# Patient Record
Sex: Male | Born: 1980 | Marital: Single | Smoking: Former smoker
Health system: Southern US, Community
[De-identification: ages and names within clinical notes are randomized; demographics above are authoritative.]

## PROBLEM LIST (undated history)

## (undated) DIAGNOSIS — K580 Irritable bowel syndrome with diarrhea: Secondary | ICD-10-CM

## (undated) DIAGNOSIS — K644 Residual hemorrhoidal skin tags: Secondary | ICD-10-CM

## (undated) DIAGNOSIS — K219 Gastro-esophageal reflux disease without esophagitis: Secondary | ICD-10-CM

## (undated) DIAGNOSIS — K649 Unspecified hemorrhoids: Secondary | ICD-10-CM

## (undated) HISTORY — DX: Residual hemorrhoidal skin tags: K64.4

## (undated) HISTORY — DX: Irritable bowel syndrome with diarrhea: K58.0

## (undated) HISTORY — PX: OTHER SURGICAL HISTORY: SHX169

## (undated) HISTORY — DX: Unspecified hemorrhoids: K64.9

---

## 2013-02-11 ENCOUNTER — Encounter: Payer: Self-pay | Admitting: Internal Medicine

## 2013-02-11 ENCOUNTER — Ambulatory Visit: Payer: Self-pay | Admitting: Physician Assistant

## 2013-02-11 ENCOUNTER — Encounter: Payer: Self-pay | Admitting: Gastroenterology

## 2013-02-11 ENCOUNTER — Telehealth: Payer: Self-pay

## 2013-02-11 ENCOUNTER — Other Ambulatory Visit (INDEPENDENT_AMBULATORY_CARE_PROVIDER_SITE_OTHER): Payer: BC Managed Care – PPO

## 2013-02-11 ENCOUNTER — Ambulatory Visit (INDEPENDENT_AMBULATORY_CARE_PROVIDER_SITE_OTHER): Payer: BC Managed Care – PPO | Admitting: Gastroenterology

## 2013-02-11 VITALS — BP 140/84 | HR 66 | Ht 69.5 in | Wt 207.6 lb

## 2013-02-11 DIAGNOSIS — R109 Unspecified abdominal pain: Secondary | ICD-10-CM | POA: Insufficient documentation

## 2013-02-11 DIAGNOSIS — K625 Hemorrhage of anus and rectum: Secondary | ICD-10-CM

## 2013-02-11 DIAGNOSIS — R198 Other specified symptoms and signs involving the digestive system and abdomen: Secondary | ICD-10-CM | POA: Insufficient documentation

## 2013-02-11 DIAGNOSIS — R194 Change in bowel habit: Secondary | ICD-10-CM | POA: Insufficient documentation

## 2013-02-11 LAB — CBC WITH DIFFERENTIAL/PLATELET
Basophils Absolute: 0 10*3/uL (ref 0.0–0.1)
Basophils Relative: 0.4 % (ref 0.0–3.0)
Eosinophils Relative: 1 % (ref 0.0–5.0)
HCT: 48.3 % (ref 39.0–52.0)
Hemoglobin: 16.2 g/dL (ref 13.0–17.0)
Lymphocytes Relative: 22.9 % (ref 12.0–46.0)
Lymphs Abs: 2.1 10*3/uL (ref 0.7–4.0)
Monocytes Relative: 5.7 % (ref 3.0–12.0)
Neutro Abs: 6.4 10*3/uL (ref 1.4–7.7)
RBC: 5.25 Mil/uL (ref 4.22–5.81)
RDW: 12.9 % (ref 11.5–14.6)
WBC: 9.2 10*3/uL (ref 4.5–10.5)

## 2013-02-11 LAB — C-REACTIVE PROTEIN: CRP: 0.6 mg/dL (ref 0.5–20.0)

## 2013-02-11 LAB — COMPREHENSIVE METABOLIC PANEL
Albumin: 4.3 g/dL (ref 3.5–5.2)
BUN: 14 mg/dL (ref 6–23)
CO2: 26 mEq/L (ref 19–32)
Calcium: 9.5 mg/dL (ref 8.4–10.5)
Chloride: 101 mEq/L (ref 96–112)
Glucose, Bld: 98 mg/dL (ref 70–99)
Potassium: 4.2 mEq/L (ref 3.5–5.1)

## 2013-02-11 MED ORDER — HYOSCYAMINE SULFATE 0.125 MG SL SUBL: 0.1250 mg | SUBLINGUAL_TABLET | Freq: Four times a day (QID) | SUBLINGUAL | Status: DC | PRN

## 2013-02-11 NOTE — Progress Notes (Addendum)
02/11/2013 Jacob Barnett [id number] Dec 20, 1980   HISTORY OF PRESENT ILLNESS:  Patient is a pleasant 32 year old male who presents to our office today with complaints of abdominal pain, gas/bloating, loose stools, and rectal bleeding, which have all been occurring intermittently for the past 3 months or so.  Has diffuse abdominal pains that come and go.  Is having only two bowel movements a day, but they have always loose since he has not been feeling well.  Sees blood on the toilet paper and in the stool.  A lot of gas and bloating.  Never had symptoms similar to this in the past.  No family history of IBD, celiac disease, or GI malignancy to his knowledge.  He was at an urgent care for these symptoms at some point within the past 3 months and was given a course of cipro and flagyl for possible infectious colitis/gastroenteritis.  Says that his symptoms may have improved for a short time, however, they returned again and have been worsening.  Says that he ate a gluten-free diet for two weeks and felt somewhat better.    History reviewed. No pertinent past medical history. Past Surgical History  Procedure Laterality Date  . Neg hx      reports that he has been smoking Cigarettes.  He has a 6.5 pack-year smoking history. He has never used smokeless tobacco. He reports that  drinks alcohol. He reports that he does not use illicit drugs. family history includes Alzheimer's disease in his maternal grandfather and paternal grandmother.  There is no history of Colon cancer. Allergies  Allergen Reactions  . Penicillins     As a child      No outpatient encounter prescriptions on file as of 02/11/2013.   No facility-administered encounter medications on file as of 02/11/2013.     REVIEW OF SYSTEMS  : All other systems reviewed and negative except where noted in the History of Present Illness.   PHYSICAL EXAM: BP 140/84  Pulse 66  Ht 5' 9.5" (1.765 m)  Wt 207 lb 9.6 oz (94.167 kg)  BMI 30.23  kg/m2 General: Well developed white male in no acute distress Head: Normocephalic and atraumatic Eyes:  sclerae anicteric,conjunctive pink. Ears: Normal auditory acuity Lungs: Clear throughout to auscultation Heart: Regular rate and rhythm Abdomen: Soft, non-distended. No masses or hepatomegaly noted.  Normal bowel sounds.  Mild diffuse TTP without R/R/G. Rectal:  Deferred.  Will be done at the time of colonoscopy.  Musculoskeletal: Symmetrical with no gross deformities  Skin: No lesions on visible extremities Extremities: No edema  Neurological: Alert oriented x 4, grossly nonfocal Psychological:  Alert and cooperative. Normal mood and affect  ASSESSMENT AND PLAN: -Abdominal pain with gas/bloating, change in bowel habits with loose stools, and rectal bleeding.  Rule out IBD, celiac, etc.  Will check labs including CBC, CMP, TSH, sed rate, CRP, and celiac titers.  Will schedule colonoscopy as well.  Can try levsin prn in the interim.  Addendum: Reviewed and agree with initial management. Beverley Fiedler, MD

## 2013-02-11 NOTE — Telephone Encounter (Signed)
Pt states he is having abdominal pain in his LLQ and at times his RLQ. States that when you push on his stomach it is very tender in those areas. Pt states that he has been having loose stools with blood in the stool. Pt states he was seen at an urgent care about a month ago and placed on antibiotics for 10days and the symptoms got better for a while but once off antibiotics he started having the same symptoms but they have gotten worse. Pt scheduled to see Doug Sou PA today at 3pm. Pt aware of appt date and time.

## 2013-02-11 NOTE — Telephone Encounter (Signed)
Patient called requesting to see a GI doctor.  He has blood in stool/painful and loose stools.  I offered him 03/08/13 with Dr. Marina Goodell but he feels he needs to be seen sooner and asked if we had a PA that could see him.

## 2013-02-11 NOTE — Patient Instructions (Addendum)
Your physician has requested that you go to the basement for the following lab work before leaving today: CBC, Cmet, TSH, sed rate, CRP, TTG, IGA.  We have sent the following medications to your pharmacy for you to pick up at your convenience: Levsin.  You have been scheduled for a colonoscopy with propofol. Please follow written instructions given to you at your visit today.  Please pick up your prep kit at the pharmacy within the next 1-3 days. If you use inhalers (even only as needed), please bring them with you on the day of your procedure.

## 2013-02-12 ENCOUNTER — Telehealth: Payer: Self-pay | Admitting: Gastroenterology

## 2013-02-12 LAB — TISSUE TRANSGLUTAMINASE, IGA: Tissue Transglutaminase Ab, IgA: 5 U/mL (ref <20)

## 2013-02-13 ENCOUNTER — Encounter: Payer: Self-pay | Admitting: *Deleted

## 2013-02-13 NOTE — Telephone Encounter (Signed)
Thurman Coyer to tell patient Im calling the colonoscopy prep in. I spoke to a pharmacist at 10:21 Am today 02-13-2013 at [facility].

## 2013-02-14 ENCOUNTER — Encounter: Payer: Self-pay | Admitting: Internal Medicine

## 2013-02-14 ENCOUNTER — Ambulatory Visit (AMBULATORY_SURGERY_CENTER): Payer: BC Managed Care – PPO | Admitting: Internal Medicine

## 2013-02-14 VITALS — BP 127/86 | HR 52 | Temp 98.0°F | Resp 18 | Ht 69.0 in | Wt 207.0 lb

## 2013-02-14 DIAGNOSIS — R109 Unspecified abdominal pain: Secondary | ICD-10-CM

## 2013-02-14 DIAGNOSIS — R198 Other specified symptoms and signs involving the digestive system and abdomen: Secondary | ICD-10-CM

## 2013-02-14 DIAGNOSIS — K649 Unspecified hemorrhoids: Secondary | ICD-10-CM

## 2013-02-14 DIAGNOSIS — K625 Hemorrhage of anus and rectum: Secondary | ICD-10-CM

## 2013-02-14 DIAGNOSIS — R194 Change in bowel habit: Secondary | ICD-10-CM

## 2013-02-14 MED ORDER — HYDROCORTISONE 2.5 % RE CREA: TOPICAL_CREAM | Freq: Three times a day (TID) | RECTAL | Status: DC

## 2013-02-14 MED ORDER — SODIUM CHLORIDE 0.9 % IV SOLN: 500.0000 mL | INTRAVENOUS | Status: DC

## 2013-02-14 NOTE — Progress Notes (Signed)
Patient did not experience any of the following events: a burn prior to discharge; a fall within the facility; wrong site/side/patient/procedure/implant event; or a hospital transfer or hospital admission upon discharge from the facility. (G8907) Patient did not have preoperative order for IV antibiotic SSI prophylaxis. (G8918)  

## 2013-02-14 NOTE — Patient Instructions (Addendum)

## 2013-02-14 NOTE — Op Note (Signed)
[facility] [address] [place], [address]   COLONOSCOPY PROCEDURE REPORT  PATIENT: Jacob, Barnett  MR#: [id number] BIRTHDATE: 1980/12/14 , 31  yrs. old GENDER: Male ENDOSCOPIST: Beverley Fiedler, MD PROCEDURE DATE:  02/14/2013 PROCEDURE:   Colonoscopy, diagnostic ASA CLASS:   Class I INDICATIONS:Rectal Bleeding, Abdominal pain, Loose stools, and Change in bowel habits. MEDICATIONS: MAC sedation, administered by CRNA and propofol (Diprivan) 350mg  IV  DESCRIPTION OF PROCEDURE:   After the risks benefits and alternatives of the procedure were thoroughly explained, informed consent was obtained.  A digital rectal exam revealed no rectal mass and A digital rectal exam revealed external hemorrhoids.   The LB CF-[id number]  endoscope was introduced through the anus and advanced to the terminal ileum which was intubated for a short distance. No adverse events experienced.   The quality of the prep was good, using MoviPrep  The instrument was then slowly withdrawn as the colon was fully examined.    COLON FINDINGS: The mucosa appeared normal in the terminal ileum. A normal appearing cecum, ileocecal valve, and appendiceal orifice were identified.  The ascending, hepatic flexure, transverse, splenic flexure, descending, sigmoid colon and rectum appeared unremarkable.  No polyps or cancers were seen.  Retroflexed views revealed no abnormalities. The time to cecum=2 minutes 02 seconds. Withdrawal time=7 minutes 25 seconds.  The scope was withdrawn and the procedure completed.  COMPLICATIONS: There were no complications.  ENDOSCOPIC IMPRESSION: 1.   Normal mucosa in the terminal ileum 2.   Normal colon 3.   External hemorrhoid (small)  RECOMMENDATIONS: 1.  Anusol-HC cream three times daily for external hemorrhoids. 2.  Office follow-up within 1 month 3.  Trial of Restora 1 capsule daily   eSigned:  Beverley Fiedler, MD 02/14/2013 3:49 PM   CC: The  Patient

## 2013-02-14 NOTE — Progress Notes (Signed)
Lidocaine-40mg IV prior to Propofol InductionPropofol given over incremental dosages 

## 2013-02-15 ENCOUNTER — Telehealth: Payer: Self-pay | Admitting: *Deleted

## 2013-02-15 NOTE — Telephone Encounter (Signed)
No answer left message to call if questions or concerns. 

## 2013-02-20 ENCOUNTER — Other Ambulatory Visit: Payer: Self-pay | Admitting: *Deleted

## 2013-03-19 ENCOUNTER — Encounter: Payer: Self-pay | Admitting: Internal Medicine

## 2013-03-21 ENCOUNTER — Encounter: Payer: Self-pay | Admitting: Internal Medicine

## 2013-03-22 ENCOUNTER — Ambulatory Visit: Payer: BC Managed Care – PPO | Admitting: Internal Medicine

## 2013-04-18 ENCOUNTER — Encounter: Payer: Self-pay | Admitting: Internal Medicine

## 2013-04-23 ENCOUNTER — Ambulatory Visit: Payer: BC Managed Care – PPO | Admitting: Internal Medicine

## 2013-05-24 ENCOUNTER — Encounter: Payer: Self-pay | Admitting: Internal Medicine

## 2013-05-31 ENCOUNTER — Ambulatory Visit: Payer: BC Managed Care – PPO | Admitting: Internal Medicine

## 2014-09-24 ENCOUNTER — Encounter: Payer: Self-pay | Admitting: Gastroenterology

## 2014-09-24 ENCOUNTER — Ambulatory Visit (INDEPENDENT_AMBULATORY_CARE_PROVIDER_SITE_OTHER): Payer: BC Managed Care – PPO | Admitting: Gastroenterology

## 2014-09-24 VITALS — BP 126/92 | HR 72 | Ht 69.0 in | Wt 227.0 lb

## 2014-09-24 DIAGNOSIS — R195 Other fecal abnormalities: Secondary | ICD-10-CM

## 2014-09-24 DIAGNOSIS — R143 Flatulence: Secondary | ICD-10-CM

## 2014-09-24 DIAGNOSIS — R1084 Generalized abdominal pain: Secondary | ICD-10-CM

## 2014-09-24 MED ORDER — DICYCLOMINE HCL 20 MG PO TABS: 20.0000 mg | ORAL_TABLET | Freq: Two times a day (BID) | ORAL | Status: DC

## 2014-09-24 MED ORDER — PANTOPRAZOLE SODIUM 40 MG PO TBEC: 40.0000 mg | DELAYED_RELEASE_TABLET | Freq: Every day | ORAL | Status: DC

## 2014-09-24 NOTE — Patient Instructions (Signed)
We have sent the following medications to your pharmacy for you to pick up at your convenience: Xifaxan 550 mg, please take one tablet by mouth three times daily for ten days Prescription was sent to [facility], it will take a couple of days to get a response back from them because they will have to contact your insurance company   Pantoprazole 40 mg, please take one tablet by mouth once daily thirty minutes before breakfast   Bentyl 20 mg was printed and given to you today if you decide to take it   You will be due for an office visit for 10/24/2014. We will send you a reminder in the mail when it gets closer to that time.

## 2014-09-26 ENCOUNTER — Telehealth: Payer: Self-pay | Admitting: Gastroenterology

## 2014-09-26 DIAGNOSIS — R143 Flatulence: Secondary | ICD-10-CM | POA: Insufficient documentation

## 2014-09-26 DIAGNOSIS — R1084 Generalized abdominal pain: Secondary | ICD-10-CM | POA: Insufficient documentation

## 2014-09-26 DIAGNOSIS — R195 Other fecal abnormalities: Secondary | ICD-10-CM | POA: Insufficient documentation

## 2014-09-26 NOTE — Telephone Encounter (Signed)
LMOM for patient to call back.

## 2014-09-26 NOTE — Progress Notes (Addendum)
     09/26/2014 Jacob Barnett [id number] 1980-12-04   History of Present Illness:  This is a 33 year old male who was previously seen in our office in 01/2013 with complaints of rectal bleeding, abdominal pain with gas/bloating, and loose stools.  CBC, CMP, TSH, sed rate, CRP, and celiac studies were negative/normal.  Colonoscopy was performed on 02/15/2012 at which time the study was normal except for small external hemorrhoid.  He presents to our office today with the same complaints of diffuse abdominal pain, loose stools, and gas/bloating.  He says that his symptoms are present every morning and last until about noon then they seem to subside.  Pain is mid-abdomen, sometimes localizes to the left side.  Has "tremendous gas" and bloating.  Says that he has tried eliminating different things from his diet for several weeks without improvement in his symptoms (including dairy and gluten).  Says that the levsin that he was given previously did not seem to help and probiotics did not help either.  Gas-X did not help.  Is very distressed and wants to get this issue fixed.  Of note, he previously seemed to have transient improvement with a course of cipro and flagyl prior to being seen here in 2014.  He also reports acid reflux, increased recently.  Says that Zantac used to help but doesn't really make a difference anymore.     Current Medications, Allergies, Past Medical History, Past Surgical History, Family History and Social History were reviewed in [facility] electronic medical record.   Physical Exam: BP 126/92 mmHg  Pulse 72  Ht 5\' 9"  (1.753 m)  Wt 227 lb (102.967 kg)  BMI 33.51 kg/m2 General: Well developed male in no acute distress Head: Normocephalic and atraumatic Eyes:  Sclerae anicteric, conjunctiva pink  Ears: Normal auditory acuity Lungs: Clear throughout to auscultation Heart: Regular rate and rhythm Abdomen: Soft, non-distended.  Normal bowel sounds.  Minimal diffuse TTP  without R/R/G. Musculoskeletal: Symmetrical with no gross deformities  Extremities: No edema  Neurological: Alert oriented x 4, grossly non-focal Psychological:  Alert and cooperative. Normal mood and affect  Assessment and Recommendations: -33 year old male with complaints of abdominal pain, loose stools, gas/bloating:  Never evaluation at this point.  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  Previously had some response to a course of cipro and flagyl.  Will try Xifaxan 550 mg TID for 10 days for SIBO/IBS-D.  Will give Bentyl 20 mg BID to try for now as well.  Discussed CT scan if symptoms persist despite these treatment regimens, but strongly suspect functional symptoms.  Follow-up in approximately 4 weeks. -GERD:  Will try pantoprazole 40 mg daily.  Addendum: Reviewed and agree with initial management. Beverley FiedlerJay M Pyrtle, MD

## 2014-11-29 ENCOUNTER — Encounter: Payer: Self-pay | Admitting: Gastroenterology

## 2015-03-26 ENCOUNTER — Encounter: Payer: Self-pay | Admitting: Internal Medicine

## 2015-10-22 ENCOUNTER — Other Ambulatory Visit: Payer: Self-pay | Admitting: Gastroenterology

## 2015-10-23 ENCOUNTER — Other Ambulatory Visit: Payer: Self-pay | Admitting: Gastroenterology

## 2015-11-10 ENCOUNTER — Emergency Department
Admission: EM | Admit: 2015-11-10 | Discharge: 2015-11-10 | Disposition: A | Discharge status: Home / Self Care | Payer: 59 | Attending: Emergency Medicine | Admitting: Emergency Medicine

## 2015-11-10 ENCOUNTER — Emergency Department: Payer: 59

## 2015-11-10 DIAGNOSIS — R110 Nausea: Secondary | ICD-10-CM | POA: Diagnosis not present

## 2015-11-10 DIAGNOSIS — R079 Chest pain, unspecified: Secondary | ICD-10-CM | POA: Diagnosis present

## 2015-11-10 DIAGNOSIS — Z880 Allergy status to penicillin: Secondary | ICD-10-CM | POA: Insufficient documentation

## 2015-11-10 DIAGNOSIS — Z87891 Personal history of nicotine dependence: Secondary | ICD-10-CM | POA: Diagnosis not present

## 2015-11-10 DIAGNOSIS — R197 Diarrhea, unspecified: Secondary | ICD-10-CM | POA: Insufficient documentation

## 2015-11-10 HISTORY — DX: Gastro-esophageal reflux disease without esophagitis: K21.9

## 2015-11-10 LAB — CBC
HCT: 47.5 % (ref 40.0–52.0)
HEMOGLOBIN: 16.1 g/dL (ref 13.0–18.0)
MCH: 30.8 pg (ref 26.0–34.0)
MCHC: 33.9 g/dL (ref 32.0–36.0)
MCV: 90.7 fL (ref 80.0–100.0)
PLATELETS: 162 10*3/uL (ref 150–440)
RBC: 5.24 MIL/uL (ref 4.40–5.90)
RDW: 13.4 % (ref 11.5–14.5)
WBC: 7.7 10*3/uL (ref 3.8–10.6)

## 2015-11-10 LAB — URINALYSIS COMPLETE WITH MICROSCOPIC (ARMC ONLY)
BILIRUBIN URINE: NEGATIVE
Bacteria, UA: NONE SEEN
Glucose, UA: NEGATIVE mg/dL
Hgb urine dipstick: NEGATIVE
KETONES UR: NEGATIVE mg/dL
Leukocytes, UA: NEGATIVE
NITRITE: NEGATIVE
PROTEIN: NEGATIVE mg/dL
RBC / HPF: NONE SEEN RBC/hpf (ref 0–5)
SPECIFIC GRAVITY, URINE: 1.026 (ref 1.005–1.030)
Squamous Epithelial / LPF: NONE SEEN
pH: 5 (ref 5.0–8.0)

## 2015-11-10 LAB — COMPREHENSIVE METABOLIC PANEL
ALK PHOS: 62 U/L (ref 38–126)
ALT: 30 U/L (ref 17–63)
ANION GAP: 11 (ref 5–15)
AST: 33 U/L (ref 15–41)
Albumin: 4.3 g/dL (ref 3.5–5.0)
BILIRUBIN TOTAL: 1.2 mg/dL (ref 0.3–1.2)
BUN: 17 mg/dL (ref 6–20)
CALCIUM: 9.2 mg/dL (ref 8.9–10.3)
CO2: 20 mmol/L — ABNORMAL LOW (ref 22–32)
CREATININE: 0.99 mg/dL (ref 0.61–1.24)
Chloride: 103 mmol/L (ref 101–111)
GFR calc non Af Amer: >60 mL/min (ref >60)
GLUCOSE: 169 mg/dL — AB (ref 65–99)
Potassium: 3.9 mmol/L (ref 3.5–5.1)
Sodium: 134 mmol/L — ABNORMAL LOW (ref 135–145)
TOTAL PROTEIN: 7.9 g/dL (ref 6.5–8.1)

## 2015-11-10 LAB — LIPASE, BLOOD: Lipase: 21 U/L (ref 11–51)

## 2015-11-10 LAB — TROPONIN I: Troponin I: <0.03 ng/mL (ref <0.031)

## 2015-11-10 NOTE — ED Notes (Signed)
Pt c/o chest pain that started 1hr PTA with nausea.. States he had diarrhea throughout the night.

## 2015-11-10 NOTE — Discharge Instructions (Signed)
Diarrhea Diarrhea is frequent loose and watery bowel movements. It can cause you to feel weak and dehydrated. Dehydration can cause you to become tired and thirsty, have a dry mouth, and have decreased urination that often is dark yellow. Diarrhea is a sign of another problem, most often an infection that will not last long. In most cases, diarrhea typically lasts 2-3 days. However, it can last longer if it is a sign of something more serious. It is important to treat your diarrhea as directed by your caregiver to lessen or prevent future episodes of diarrhea. CAUSES  Some common causes include:  Gastrointestinal infections caused by viruses, bacteria, or parasites.  Food poisoning or food allergies.  Certain medicines, such as antibiotics, chemotherapy, and laxatives.  Artificial sweeteners and fructose.  Digestive disorders. HOME CARE INSTRUCTIONS  Ensure adequate fluid intake (hydration): Have 1 cup (8 oz) of fluid for each diarrhea episode. Avoid fluids that contain simple sugars or sports drinks, fruit juices, whole milk products, and sodas. Your urine should be clear or pale yellow if you are drinking enough fluids. Hydrate with an oral rehydration solution that you can purchase at pharmacies, retail stores, and online. You can prepare an oral rehydration solution at home by mixing the following ingredients together:   - tsp table salt.   tsp baking soda.   tsp salt substitute containing potassium chloride.  1  tablespoons sugar.  1 L (34 oz) of water.  Certain foods and beverages may increase the speed at which food moves through the gastrointestinal (GI) tract. These foods and beverages should be avoided and include:  Caffeinated and alcoholic beverages.  High-fiber foods, such as raw fruits and vegetables, nuts, seeds, and whole grain breads and cereals.  Foods and beverages sweetened with sugar alcohols, such as xylitol, sorbitol, and mannitol.  Some foods may be well  tolerated and may help thicken stool including:  Starchy foods, such as rice, toast, pasta, low-sugar cereal, oatmeal, grits, baked potatoes, crackers, and bagels.  Bananas.  Applesauce.  Add probiotic-rich foods to help increase healthy bacteria in the GI tract, such as yogurt and fermented milk products.  Wash your hands well after each diarrhea episode.  Only take over-the-counter or prescription medicines as directed by your caregiver.  Take a warm bath to relieve any burning or pain from frequent diarrhea episodes. SEEK IMMEDIATE MEDICAL CARE IF:   You are unable to keep fluids down.  You have persistent vomiting.  You have blood in your stool, or your stools are black and tarry.  You do not urinate in 6-8 hours, or there is only a small amount of very dark urine.  You have abdominal pain that increases or localizes.  You have weakness, dizziness, confusion, or light-headedness.  You have a severe headache.  Your diarrhea gets worse or does not get better.  You have a fever or persistent symptoms for more than 2-3 days.  You have a fever and your symptoms suddenly get worse. MAKE SURE YOU:   Understand these instructions.  Will watch your condition.  Will get help right away if you are not doing well or get worse.   This information is not intended to replace advice given to you by your health care provider. Make sure you discuss any questions you have with your health care provider.   Document Released: 10/21/2002 Document Revised: 11/21/2014 Document Reviewed: 07/08/2012 Elsevier Interactive Patient Education 2016 Elsevier Inc.  Nonspecific Chest Pain  Chest pain can be caused by many  different conditions. There is always a chance that your pain could be related to something serious, such as a heart attack or a blood clot in your lungs. Chest pain can also be caused by conditions that are not life-threatening. If you have chest pain, it is very important to  follow up with your health care provider. CAUSES  Chest pain can be caused by:  Heartburn.  Pneumonia or bronchitis.  Anxiety or stress.  Inflammation around your heart (pericarditis) or lung (pleuritis or pleurisy).  A blood clot in your lung.  A collapsed lung (pneumothorax). It can develop suddenly on its own (spontaneous pneumothorax) or from trauma to the chest.  Shingles infection (varicella-zoster virus).  Heart attack.  Damage to the bones, muscles, and cartilage that make up your chest wall. This can include:  Bruised bones due to injury.  Strained muscles or cartilage due to frequent or repeated coughing or overwork.  Fracture to one or more ribs.  Sore cartilage due to inflammation (costochondritis). RISK FACTORS  Risk factors for chest pain may include:  Activities that increase your risk for trauma or injury to your chest.  Respiratory infections or conditions that cause frequent coughing.  Medical conditions or overeating that can cause heartburn.  Heart disease or family history of heart disease.  Conditions or health behaviors that increase your risk of developing a blood clot.  Having had chicken pox (varicella zoster). SIGNS AND SYMPTOMS Chest pain can feel like:  Burning or tingling on the surface of your chest or deep in your chest.  Crushing, pressure, aching, or squeezing pain.  Dull or sharp pain that is worse when you move, cough, or take a deep breath.  Pain that is also felt in your back, neck, shoulder, or arm, or pain that spreads to any of these areas. Your chest pain may come and go, or it may stay constant. DIAGNOSIS Lab tests or other studies may be needed to find the cause of your pain. Your health care provider may have you take a test called an ambulatory ECG (electrocardiogram). An ECG records your heartbeat patterns at the time the test is performed. You may also have other tests, such as:  Transthoracic echocardiogram  (TTE). During echocardiography, sound waves are used to create a picture of all of the heart structures and to look at how blood flows through your heart.  Transesophageal echocardiogram (TEE).This is a more advanced imaging test that obtains images from inside your body. It allows your health care provider to see your heart in finer detail.  Cardiac monitoring. This allows your health care provider to monitor your heart rate and rhythm in real time.  Holter monitor. This is a portable device that records your heartbeat and can help to diagnose abnormal heartbeats. It allows your health care provider to track your heart activity for several days, if needed.  Stress tests. These can be done through exercise or by taking medicine that makes your heart beat more quickly.  Blood tests.  Imaging tests. TREATMENT  Your treatment depends on what is causing your chest pain. Treatment may include:  Medicines. These may include:  Acid blockers for heartburn.  Anti-inflammatory medicine.  Pain medicine for inflammatory conditions.  Antibiotic medicine, if an infection is present.  Medicines to dissolve blood clots.  Medicines to treat coronary artery disease.  Supportive care for conditions that do not require medicines. This may include:  Resting.  Applying heat or cold packs to injured areas.  Limiting activities until  pain decreases. HOME CARE INSTRUCTIONS  If you were prescribed an antibiotic medicine, finish it all even if you start to feel better.  Avoid any activities that bring on chest pain.  Do not use any tobacco products, including cigarettes, chewing tobacco, or electronic cigarettes. If you need help quitting, ask your health care provider.  Do not drink alcohol.  Take medicines only as directed by your health care provider.  Keep all follow-up visits as directed by your health care provider. This is important. This includes any further testing if your chest pain  does not go away.  If heartburn is the cause for your chest pain, you may be told to keep your head raised (elevated) while sleeping. This reduces the chance that acid will go from your stomach into your esophagus.  Make lifestyle changes as directed by your health care provider. These may include:  Getting regular exercise. Ask your health care provider to suggest some activities that are safe for you.  Eating a heart-healthy diet. A registered dietitian can help you to learn healthy eating options.  Maintaining a healthy weight.  Managing diabetes, if necessary.  Reducing stress. SEEK MEDICAL CARE IF:  Your chest pain does not go away after treatment.  You have a rash with blisters on your chest.  You have a fever. SEEK IMMEDIATE MEDICAL CARE IF:   Your chest pain is worse.  You have an increasing cough, or you cough up blood.  You have severe abdominal pain.  You have severe weakness.  You faint.  You have chills.  You have sudden, unexplained chest discomfort.  You have sudden, unexplained discomfort in your arms, back, neck, or jaw.  You have shortness of breath at any time.  You suddenly start to sweat, or your skin gets clammy.  You feel nauseous or you vomit.  You suddenly feel light-headed or dizzy.  Your heart begins to beat quickly, or it feels like it is skipping beats. These symptoms may represent a serious problem that is an emergency. Do not wait to see if the symptoms will go away. Get medical help right away. Call your local emergency services (911 in the [place] Do not drive yourself to the hospital.   This information is not intended to replace advice given to you by your health care provider. Make sure you discuss any questions you have with your health care provider.   Document Released: 08/10/2005 Document Revised: 11/21/2014 Document Reviewed: 06/06/2014 Elsevier Interactive Patient Education [employer]2016 Elsevier Inc.

## 2015-11-10 NOTE — ED Provider Notes (Signed)
[facility] Emergency Department Provider Note     Time seen: ----------------------------------------- 12:14 PM on 11/10/2015 -----------------------------------------    I have reviewed the triage vital signs and the nursing notes.   HISTORY  Chief Complaint Chest Pain and Diarrhea    HPI Jacob Barnett is a 34 y.o. male who presents ER with chest pain that started 1 hour prior to arrival with nausea. Patient states he had diarrhea throughout the night and then sudden onset chest pain this morning, nothing makes it better or worse. He has not had a history of same, he has no significant risk factors for coronary artery disease.   Past Medical History  Diagnosis Date  . Hemorrhoids   . GERD (gastroesophageal reflux disease)     Patient Active Problem List   Diagnosis Date Noted  . Generalized abdominal pain 09/26/2014  . Loose stools 09/26/2014  . Excessive gas 09/26/2014  . Rectal bleeding 02/11/2013  . Abdominal pain, other specified site 02/11/2013  . Change in bowel habits 02/11/2013  . Other symptoms involving digestive system(787.99) 02/11/2013    Past Surgical History  Procedure Laterality Date  . Neg hx      Allergies Penicillins  Social History Social History  Substance Use Topics  . Smoking status: Former Smoker -- 0.50 packs/day for 13 years    Types: Cigarettes  . Smokeless tobacco: Never Used     Comment: Tobacco info given 02/11/13  . Alcohol Use: 0.0 oz/week    0 Standard drinks or equivalent per week    Review of Systems Constitutional: Negative for fever. Eyes: Negative for visual changes. ENT: Negative for sore throat. Cardiovascular: Positive for chest pain Respiratory: Negative for shortness of breath. Gastrointestinal: Negative for abdominal pain, vomiting, for diarrhea Genitourinary: Negative for dysuria. Musculoskeletal: Negative for back pain. Skin: Negative for rash. Neurological: Negative for  headaches, focal weakness or numbness.  10-point ROS otherwise negative.  ____________________________________________   PHYSICAL EXAM:  VITAL SIGNS: ED Triage Vitals  Enc Vitals Group     BP 11/10/15 1000 142/90 mmHg     Pulse Rate 11/10/15 1000 87     Resp 11/10/15 1000 18     Temp 11/10/15 1000 98.3 F (36.8 C)     Temp Source 11/10/15 1000 Oral     SpO2 11/10/15 1000 96 %     Weight 11/10/15 1000 216 lb (97.977 kg)     Height 11/10/15 1000 5\' 9"  (1.753 m)     Head Cir --      Peak Flow --      Pain Score 11/10/15 1001 3     Pain Loc --      Pain Edu? --      Excl. in GC? --     Constitutional: Alert and oriented. Well appearing and in no distress. Eyes: Conjunctivae are normal. PERRL. Normal extraocular movements. ENT   Head: Normocephalic and atraumatic.   Nose: No congestion/rhinnorhea.   Mouth/Throat: Mucous membranes are moist.   Neck: No stridor. Cardiovascular: Normal rate, regular rhythm. Normal and symmetric distal pulses are present in all extremities. No murmurs, rubs, or gallops. Respiratory: Normal respiratory effort without tachypnea nor retractions. Breath sounds are clear and equal bilaterally. No wheezes/rales/rhonchi. Gastrointestinal: Soft and nontender. No distention. No abdominal bruits.  Musculoskeletal: Nontender with normal range of motion in all extremities. No joint effusions.  No lower extremity tenderness nor edema. Neurologic:  Normal speech and language. No gross focal neurologic deficits are appreciated. Speech is normal.  No gait instability. Skin:  Skin is warm, dry and intact. No rash noted. Psychiatric: Mood and affect are normal. Speech and behavior are normal. Patient exhibits appropriate insight and judgment. ____________________________________________  EKG: Interpreted by me. Normal sinus rhythm with a rate of 83 bpm, normal PR interval, normal QS with, normal QT  interval.  ____________________________________________  ED COURSE:  Pertinent labs & imaging results that were available during my care of the patient were reviewed by me and considered in my medical decision making (see chart for details). Patient appears likely anxious earlier, unclear etiology of his symptoms. We'll check basic labs and reevaluate. ____________________________________________    LABS (pertinent positives/negatives)  Labs Reviewed  COMPREHENSIVE METABOLIC PANEL - Abnormal; Notable for the following:    Sodium 134 (*)    CO2 20 (*)    Glucose, Bld 169 (*)    All other components within normal limits  LIPASE, BLOOD  CBC  TROPONIN I  URINALYSIS COMPLETEWITH MICROSCOPIC ([facility] ONLY)    RADIOLOGY Images were viewed by me  Chest x-ray  IMPRESSION: No active cardiopulmonary disease. ____________________________________________  FINAL ASSESSMENT AND PLAN  Chest pain, diarrhea  Plan: Patient with labs and imaging as dictated above. Chest pain was possibly anxiety induced. His labs are normal, he is low risk for ACS. Heart score is low. His diarrhea has resolved. This is likely viral etiology. He is stable for outpatient follow-up.   Emily Filbert, MD   Emily Filbert, MD 11/10/15 [phone]

## 2015-11-19 ENCOUNTER — Encounter: Payer: Self-pay | Admitting: *Deleted

## 2015-12-17 ENCOUNTER — Encounter: Payer: Self-pay | Admitting: Internal Medicine

## 2015-12-17 ENCOUNTER — Ambulatory Visit (INDEPENDENT_AMBULATORY_CARE_PROVIDER_SITE_OTHER): Payer: 59 | Admitting: Internal Medicine

## 2015-12-17 VITALS — BP 144/90 | HR 76 | Ht 68.7 in | Wt 227.4 lb

## 2015-12-17 DIAGNOSIS — R1013 Epigastric pain: Secondary | ICD-10-CM | POA: Diagnosis not present

## 2015-12-17 DIAGNOSIS — K648 Other hemorrhoids: Secondary | ICD-10-CM

## 2015-12-17 DIAGNOSIS — K219 Gastro-esophageal reflux disease without esophagitis: Secondary | ICD-10-CM

## 2015-12-17 DIAGNOSIS — K580 Irritable bowel syndrome with diarrhea: Secondary | ICD-10-CM | POA: Diagnosis not present

## 2015-12-17 MED ORDER — PANTOPRAZOLE SODIUM 40 MG PO TBEC: 40.0000 mg | DELAYED_RELEASE_TABLET | Freq: Every day | ORAL | Status: DC

## 2015-12-17 NOTE — Progress Notes (Signed)
   Subjective:    Patient ID: Jacob Barnett, male    DOB: August 22, 1981, 35 y.o.   MRN: [id number]  HPI [name] is a 35 year old male with past medical history of GERD, dyspepsia, IBS with gas and bloating, loose stools, and history of rectal bleeding who is here for follow-up. He was last seen in November 2015 by Doug Sou, PA-C. Prior evaluation has been unrevealing including negative/normal CBC, CMP, TSH, sedimentation rate, CRP and celiac study. He had colonoscopy in April 2013 which was normal except for small hemorrhoids. He was eventually put on pantoprazole 40 mg daily and this is been a dramatic improvement for him. He's had resolution of heartburn and dyspeptic symptoms. He still has frequent issues with lower GI gas and at times bloating. He was treated with metronidazole and Flagyl and then most recently 1 year ago with rifaximin. Rifaximin seem to help dramatically. He does continue to have issues with intermittent rectal bleeding and he is assisted and hemorrhoidal banding. He denies abdominal pain today. No nausea, vomiting, dysphagia or odynophagia.  Review of Systems As per history of present illness, otherwise negative  Current Medications, Allergies, Past Medical History, Past Surgical History, Family History and Social History were reviewed in [employer] record.     Objective:   Physical Exam BP 144/90 mmHg  Pulse 76  Ht 5' 8.7" (1.745 m)  Wt 227 lb 6 oz (103.137 kg)  BMI 33.87 kg/m2 Constitutional: Well-developed and well-nourished. No distress. HEENT: Normocephalic and atraumatic. Conjunctivae are normal.  No scleral icterus. Neck: Neck supple. Trachea midline. Cardiovascular: Normal rate, regular rhythm and intact distal pulses. No M/R/G Pulmonary/chest: Effort normal and breath sounds normal. No wheezing, rales or rhonchi. Abdominal: Soft, nontender, nondistended. Bowel sounds active throughout. There are no masses palpable. No  hepatosplenomegaly. Extremities: no clubbing, cyanosis, or edema Neurological: Alert and oriented to person place and time. Skin: Skin is warm and dry. Multiple tattoos bilateral upper extremities Psychiatric: Normal mood and affect. Behavior is normal.  Labs and colonoscopy reviewed including with the patient    Assessment & Plan:   35 year old male with past medical history of GERD, dyspepsia, IBS with gas and bloating, loose stools, and history of rectal bleeding who is here for follow-up.  1. GERD with dyspepsia -- dramatic improvement on pantoprazole. We discussed the risks, benefits and alternatives to PPI therapy including long-term use. He wishes to continue this medication and given response I feel that this is reasonable. We'll continue pantoprazole 40 mg daily. This medication works best 30 minus before breakfast daily  2. IBS with gas and intermittent loose stools -- great response to rifaximin. Repeat treatment with rifaximin 550 mg 3 times a day 14 days. He very well may have a component of small intestinal bacterial overgrowth which would also be treated with this therapy.  3. Rectal bleeding -- painless in nature and occurring multiple days per week. No melena. Blood is bright red and seen with wiping. We spent time discussing the risks, benefits and alternatives to hemorrhoidal banding and he wishes to proceed. He will check to be sure that this will be covered by his insurance and call to schedule a banding visit.  25 minutes spent with the patient today. Greater than 50% was spent in counseling and coordination of care with the patient 1 yr follow-up and possibly sooner for banding of symptomatic bleeding internal hemorrhoids

## 2015-12-17 NOTE — Patient Instructions (Signed)
You have been scheduled for hemorrhoidal banding on 01/15/16 @ 3:30 pm.  We have sent the following medications to your pharmacy for you to pick up at your convenience: Pantoprazole 40 mg daily  We have sent your demographic information and a prescription for Xifaxan to [facility]. This pharmacy is able to get medication approved through insurance and get you the lowest copay possible. If you have not heard from them within 1 week, please call our office at [phone] to let us know.

## 2015-12-29 ENCOUNTER — Telehealth: Payer: Self-pay | Admitting: Internal Medicine

## 2015-12-30 NOTE — Telephone Encounter (Signed)
I have advised the patient that insurance will not cover rx this time. I have contacted our drug rep who will bring samples next week. Patient verbalizes understanding.

## 2016-01-07 MED ORDER — RIFAXIMIN 550 MG PO TABS: 550.0000 mg | ORAL_TABLET | Freq: Three times a day (TID) | ORAL | Status: DC

## 2016-01-07 NOTE — Telephone Encounter (Signed)
Samples placed at front desk. Left message advising patient.

## 2016-01-07 NOTE — Addendum Note (Signed)
Addended by: Richardson Chiquito on: 01/07/2016 02:29 PM   Modules accepted: Orders

## 2016-01-15 ENCOUNTER — Encounter: Payer: Self-pay | Admitting: Internal Medicine

## 2016-01-15 ENCOUNTER — Ambulatory Visit (INDEPENDENT_AMBULATORY_CARE_PROVIDER_SITE_OTHER): Payer: 59 | Admitting: Internal Medicine

## 2016-01-15 VITALS — BP 146/84 | HR 90 | Ht 68.75 in | Wt 225.0 lb

## 2016-01-15 DIAGNOSIS — K648 Other hemorrhoids: Secondary | ICD-10-CM | POA: Diagnosis not present

## 2016-01-15 NOTE — Progress Notes (Signed)
Patient ID: Jacob Barnett, male   DOB: 1981-08-24, 35 y.o.   MRN: [id number] Patient presents for consideration of banding of internal hemorrhoids He reports he is having bright red, painless, rectal bleeding with bowel movement which is been increasing in frequency over the last several months. He sees bright red blood with almost each bowel movement. Denies severe constipation or diarrhea, though he does have intermittent loose stools  PROCEDURE NOTE: The patient presents with symptomatic grade 2 internal hemorrhoids, requesting rubber band ligation of his hemorrhoidal disease.  All risks, benefits and alternative forms of therapy were described and informed consent was obtained.  In the Left Lateral Decubitus position anoscopic examination revealed grade 2 hemorrhoids in the LL=RA>RP positions.  Rectal exam revealed long anal sphincter with some spasm The anorectum was pre-medicated with 0.125% nitroglycerin ointment The decision was made to band the left lateral internal hemorrhoid, and the [facility] O'Regan System was used to perform band ligation without complication.  Digital anorectal examination was then performed to assure proper positioning of the band, and to adjust the banded tissue as required.  The patient was discharged home without pain or other issues.  Dietary and behavioral recommendations were given and along with follow-up instructions.     The patient will return as scheduled for  follow-up and possible additional banding as required. No complications were encountered and the patient tolerated the procedure well.

## 2016-01-15 NOTE — Patient Instructions (Addendum)
You have been scheduled for your 2nd hemorrhoidal banding on Wednesday, 02/03/16 @ 10:45 am.  HEMORRHOID BANDING PROCEDURE    FOLLOW-UP CARE   1. The procedure you have had should have been relatively painless since the banding of the area involved does not have nerve endings and there is no pain sensation.  The rubber band cuts off the blood supply to the hemorrhoid and the band may fall off as soon as 48 hours after the banding (the band may occasionally be seen in the toilet bowl following a bowel movement). You may notice a temporary feeling of fullness in the rectum which should respond adequately to plain Tylenol or Motrin.  2. Following the banding, avoid strenuous exercise that evening and resume full activity the next day.  A sitz bath (soaking in a warm tub) or bidet is soothing, and can be useful for cleansing the area after bowel movements.     3. To avoid constipation, take two tablespoons of natural wheat bran, natural oat bran, flax, Benefiber or any over the counter fiber supplement and increase your water intake to 7-8 glasses daily.    4. Unless you have been prescribed anorectal medication, do not put anything inside your rectum for two weeks: No suppositories, enemas, fingers, etc.  5. Occasionally, you may have more bleeding than usual after the banding procedure.  This is often from the untreated hemorrhoids rather than the treated one.  Don't be concerned if there is a tablespoon or so of blood.  If there is more blood than this, lie flat with your bottom higher than your head and apply an ice pack to the area. If the bleeding does not stop within a half an hour or if you feel faint, call our office at [phone] or go to the emergency room.  6. Problems are not common; however, if there is a substantial amount of bleeding, severe pain, chills, fever or difficulty passing urine (very rare) or other problems, you should call us at [phone] or report to the nearest  emergency room.  7. Do not stay seated continuously for more than 2-3 hours for a day or two after the procedure.  Tighten your buttock muscles 10-15 times every two hours and take 10-15 deep breaths every 1-2 hours.  Do not spend more than a few minutes on the toilet if you cannot empty your bowel; instead re-visit the toilet at a later time.

## 2016-02-03 ENCOUNTER — Encounter: Payer: Self-pay | Admitting: Internal Medicine

## 2016-02-03 ENCOUNTER — Ambulatory Visit (INDEPENDENT_AMBULATORY_CARE_PROVIDER_SITE_OTHER): Payer: 59 | Admitting: Internal Medicine

## 2016-02-03 VITALS — BP 128/80 | HR 92 | Ht 68.7 in | Wt 226.0 lb

## 2016-02-03 DIAGNOSIS — K648 Other hemorrhoids: Secondary | ICD-10-CM

## 2016-02-03 NOTE — Patient Instructions (Signed)
HEMORRHOID BANDING PROCEDURE    FOLLOW-UP CARE   1. The procedure you have had should have been relatively painless since the banding of the area involved does not have nerve endings and there is no pain sensation.  The rubber band cuts off the blood supply to the hemorrhoid and the band may fall off as soon as 48 hours after the banding (the band may occasionally be seen in the toilet bowl following a bowel movement). You may notice a temporary feeling of fullness in the rectum which should respond adequately to plain Tylenol or Motrin.  2. Following the banding, avoid strenuous exercise that evening and resume full activity the next day.  A sitz bath (soaking in a warm tub) or bidet is soothing, and can be useful for cleansing the area after bowel movements.     3. To avoid constipation, take two tablespoons of natural wheat bran, natural oat bran, flax, Benefiber or any over the counter fiber supplement and increase your water intake to 7-8 glasses daily.    4. Unless you have been prescribed anorectal medication, do not put anything inside your rectum for two weeks: No suppositories, enemas, fingers, etc.  5. Occasionally, you may have more bleeding than usual after the banding procedure.  This is often from the untreated hemorrhoids rather than the treated one.  Don't be concerned if there is a tablespoon or so of blood.  If there is more blood than this, lie flat with your bottom higher than your head and apply an ice pack to the area. If the bleeding does not stop within a half an hour or if you feel faint, call our office at [phone] or go to the emergency room.  6. Problems are not common; however, if there is a substantial amount of bleeding, severe pain, chills, fever or difficulty passing urine (very rare) or other problems, you should call us at [phone] or report to the nearest emergency room.  7. Do not stay seated continuously for more than 2-3 hours for a day or two  after the procedure.  Tighten your buttock muscles 10-15 times every two hours and take 10-15 deep breaths every 1-2 hours.  Do not spend more than a few minutes on the toilet if you cannot empty your bowel; instead re-visit the toilet at a later time.   Your 3rd banding is scheduled on 04/05/2016 at 4pm

## 2016-02-03 NOTE — Progress Notes (Signed)
Patient ID: Jacob Barnett, male   DOB: 11/23/1980, 35 y.o.   MRN: [id number] Patient returns for consideration of additional banding of symptomatic bleeding and prolapsing internal hemorrhoids. Initial banding of left lateral internal hemorrhoid on 01/15/2016. He's had dramatic reduction in pain with rectal bleeding with bowel movement. He estimates bleeding is 85% better. He is still having perianal bulging after bowel movement which is irritating.  PROCEDURE NOTE: The patient presents with symptomatic grade 2 internal hemorrhoids, requesting rubber band ligation of his hemorrhoidal disease.  All risks, benefits and alternative forms of therapy were described and informed consent was obtained.   The anorectum was pre-medicated with 0.125% nitroglycerin ointment Rectal exam again revealed long anal sphincter The decision was made to band the right anterior internal hemorrhoid, and the [facility] O'Regan System was used to perform band ligation without complication.  Digital anorectal examination was then performed to assure proper positioning of the band, and to adjust the banded tissue as required.  The patient was discharged home without pain or other issues.  Dietary and behavioral recommendations were given and along with follow-up instructions.    The patient will return in as scheduled for  follow-up and possible additional banding as required. No complications were encountered and the patient tolerated the procedure well.

## 2016-04-05 ENCOUNTER — Encounter: Payer: 59 | Admitting: Internal Medicine

## 2016-04-18 ENCOUNTER — Ambulatory Visit (INDEPENDENT_AMBULATORY_CARE_PROVIDER_SITE_OTHER): Payer: 59 | Admitting: Internal Medicine

## 2016-04-18 ENCOUNTER — Encounter: Payer: Self-pay | Admitting: Internal Medicine

## 2016-04-18 VITALS — BP 130/88 | HR 88 | Ht 69.0 in | Wt 229.4 lb

## 2016-04-18 DIAGNOSIS — K648 Other hemorrhoids: Secondary | ICD-10-CM

## 2016-04-18 NOTE — Patient Instructions (Signed)

## 2016-04-18 NOTE — Progress Notes (Signed)
Patient ID: Jacob Barnett, male   DOB: 10/13/1981, 35 y.o.   MRN: [id number] Patient returns for consideration of additional banding of symptomatic bleeding and prolapsing internal hemorrhoids. Initial banding of left lateral internal hemorrhoid on 01/15/2016, followed by right anterior 02/03/2016. He had had dramatic reduction in rectal bleeding with bowel movement, but over the last few weeks to a month he has noticed painless bright red blood with wiping and bowel movement Prolapse symptoms have improved significantly He denies pain with defecation, constipation diarrhea  PROCEDURE NOTE: The patient presents with symptomatic grade 2 internal hemorrhoids, requesting rubber band ligation of his hemorrhoidal disease. All risks, benefits and alternative forms of therapy were described and informed consent was obtained.   The anorectum was pre-medicated with 0.125% nitroglycerin ointment Rectal exam again revealed long anal sphincter with high suspicion of spasm The decision was made to band the right posterior internal hemorrhoid, and the [facility] O'Regan System was used to perform band ligation without complication. Digital anorectal examination was then performed to assure proper positioning of the band, and to adjust the banded tissue as required. The patient was discharged home without pain or other issues. Dietary and behavioral recommendations were given and along with follow-up instructions.   The patient will return as needed. If symptoms persist he may need surgical referral to see Dr. Michaell CowingGross versus attempted additional banding. No complications were encountered and the patient tolerated the procedure well.

## 2016-10-26 IMAGING — CR DG CHEST 2V
1 series · 2 of 2 positions shown · non-contrast
Comparison: None.

CLINICAL DATA: Pt c/o chest pain that started 1hr PTA with nausea..
States he had diarrhea throughout the night., denies sx to chest
region

EXAM:
CHEST  2 VIEW

[Series 1: dg chest 2 view · 0.14mm/px · 2 of 2 slices shown]
[im 1/2]
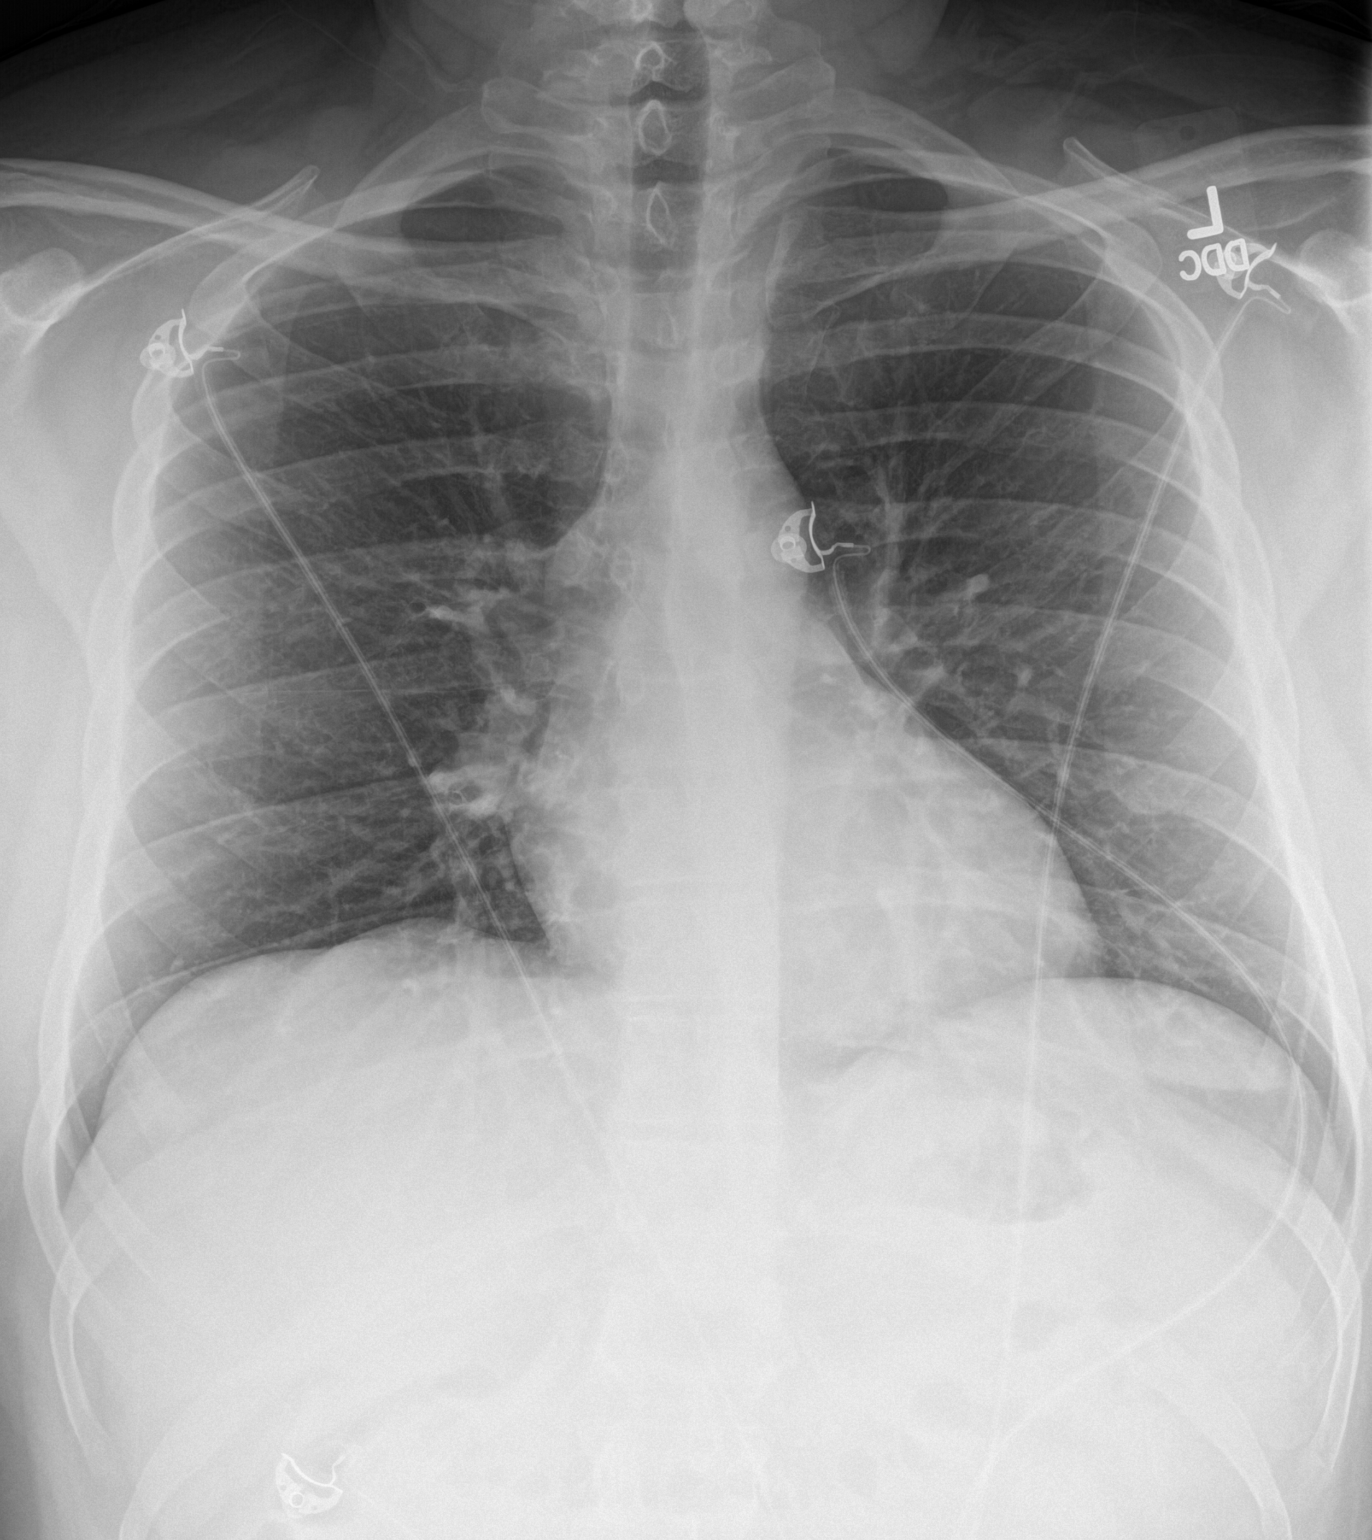
[im 2/2]
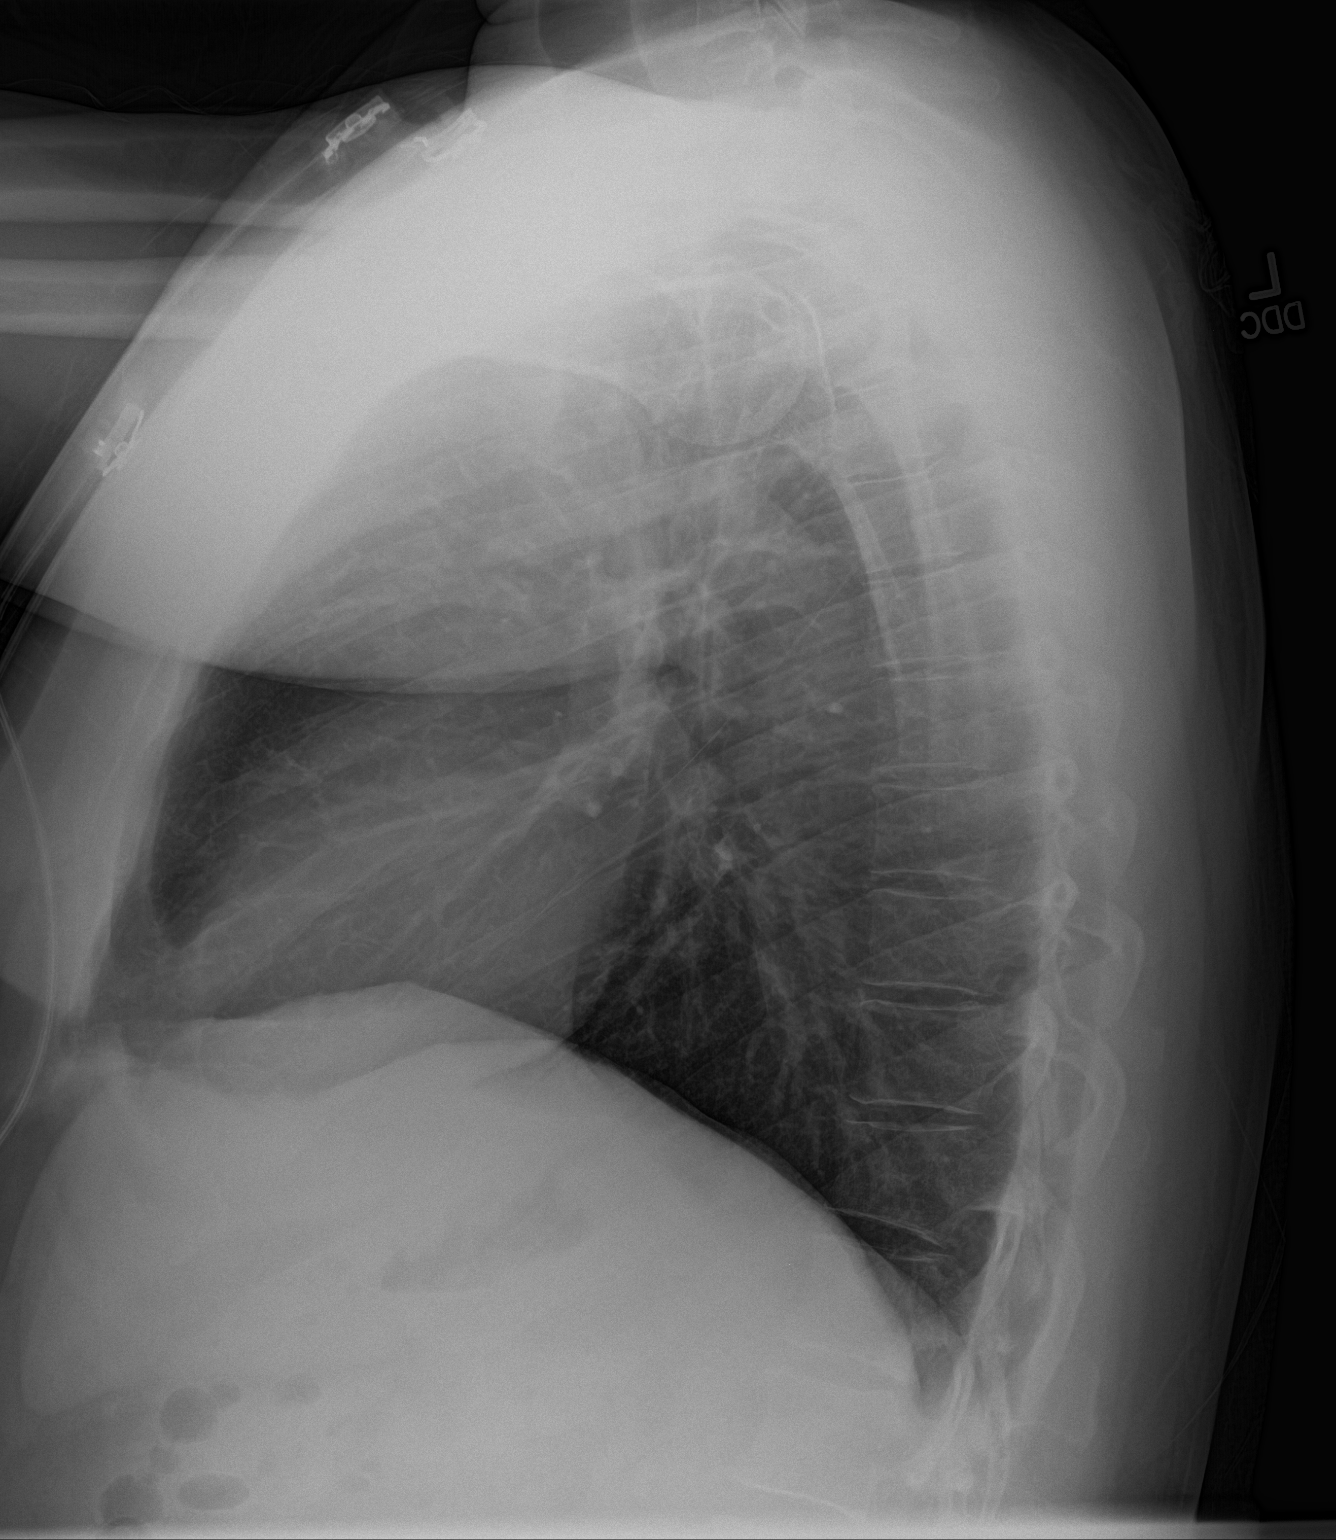

[2 of 2 positions shown; findings below may reference images not displayed]

FINDINGS: Lateral view degraded by patient arm position. Midline trachea.
Normal heart size and mediastinal contours. No pleural effusion or
pneumothorax. Clear lungs.
IMPRESSION: No active cardiopulmonary disease.

## 2017-01-08 ENCOUNTER — Other Ambulatory Visit: Payer: Self-pay | Admitting: Internal Medicine

## 2017-04-05 ENCOUNTER — Telehealth: Payer: Self-pay | Admitting: Internal Medicine

## 2017-04-05 MED ORDER — PANTOPRAZOLE SODIUM 40 MG PO TBEC: 40.0000 mg | DELAYED_RELEASE_TABLET | Freq: Every day | ORAL | 0 refills | Status: DC

## 2017-04-05 NOTE — Telephone Encounter (Signed)
Rx sent. Needs office visit for further refills. 

## 2017-04-09 ENCOUNTER — Other Ambulatory Visit: Payer: Self-pay | Admitting: Internal Medicine

## 2017-05-04 ENCOUNTER — Other Ambulatory Visit: Payer: Self-pay | Admitting: Internal Medicine

## 2017-05-09 ENCOUNTER — Telehealth: Payer: Self-pay | Admitting: Internal Medicine

## 2017-05-09 MED ORDER — PANTOPRAZOLE SODIUM 40 MG PO TBEC: 40.0000 mg | DELAYED_RELEASE_TABLET | Freq: Every day | ORAL | 0 refills | Status: DC

## 2017-05-09 NOTE — Telephone Encounter (Signed)
Rx sent 

## 2017-06-11 ENCOUNTER — Other Ambulatory Visit: Payer: Self-pay | Admitting: Internal Medicine

## 2017-08-10 ENCOUNTER — Ambulatory Visit (INDEPENDENT_AMBULATORY_CARE_PROVIDER_SITE_OTHER): Payer: 59 | Admitting: Internal Medicine

## 2017-08-10 ENCOUNTER — Encounter (INDEPENDENT_AMBULATORY_CARE_PROVIDER_SITE_OTHER): Payer: Self-pay

## 2017-08-10 ENCOUNTER — Encounter: Payer: Self-pay | Admitting: Internal Medicine

## 2017-08-10 VITALS — BP 110/86 | HR 76 | Ht 69.0 in | Wt 217.1 lb

## 2017-08-10 DIAGNOSIS — R1013 Epigastric pain: Secondary | ICD-10-CM

## 2017-08-10 DIAGNOSIS — K648 Other hemorrhoids: Secondary | ICD-10-CM

## 2017-08-10 DIAGNOSIS — K219 Gastro-esophageal reflux disease without esophagitis: Secondary | ICD-10-CM | POA: Diagnosis not present

## 2017-08-10 MED ORDER — PANTOPRAZOLE SODIUM 40 MG PO TBEC: 40.0000 mg | DELAYED_RELEASE_TABLET | Freq: Every day | ORAL | 11 refills | Status: AC

## 2017-08-10 MED ORDER — HYDROCORTISONE ACETATE 25 MG RE SUPP: 25.0000 mg | Freq: Every day | RECTAL | 1 refills | Status: AC

## 2017-08-10 NOTE — Patient Instructions (Signed)
We have sent the following medications to your pharmacy for you to pick up at your convenience: Protonix Hydrocortisone suppositories  Please follow up with Dr Rhea Belton in 2 years.  If you are age 37 or older, your body mass index should be between 23-30. Your Body mass index is 32.06 kg/m. If this is out of the aforementioned range listed, please consider follow up with your Primary Care Provider.  If you are age 52 or younger, your body mass index should be between 19-25. Your Body mass index is 32.06 kg/m. If this is out of the aformentioned range listed, please consider follow up with your Primary Care Provider.

## 2017-08-10 NOTE — Progress Notes (Signed)
   Subjective:    Patient ID: Martez Weiand, male    DOB: June 12, 1981, 36 y.o.   MRN: [id number]  HPI Gabrien Mentink is a 36 year old male with a past medical history of GERD, dyspepsia, IBS with gas and bloating, internal hemorrhoids is here for follow-up. His internal hemorrhoids were treated with hemorrhoidal banding 3. This was performed primarily for intermittent hemorrhoidal bleeding as well as prolapse symptoms. He reports this helped his hemorrhoids significantly for nearly 6 months. He reports he's had some scant bleeding as well as intermittent prolapse symptoms that have returned recently. Bowel movements a been regular for him. He was previously treated with rifaximin which helped him dramatically from a loose stool perspective. Bleeding when present is very small volume and painless. Reflux and dyspepsia is very well controlled with pantoprazole. He is using 40 mg daily. No dysphagia or odynophagia.  Review of Systems  as per history of present illness, otherwise negative  Current Medications, Allergies, Past Medical History, Past Surgical History, Family History and Social History were reviewed in [employer] record.      Objective:   Physical Exam BP 110/86   Pulse 76   Ht  (1.753 m)   Wt 217 lb 2 oz (98.5 kg)   BMI 32.06 kg/m  Constitutional: Well-developed and well-nourished. No distress. HEENT: Normocephalic and atraumatic.   No scleral icterus. Neurological: Alert and oriented to person place and time. Psychiatric: Normal mood and affect. Behavior is normal.     Assessment & Plan:  37 year old male with a past medical history of GERD, dyspepsia, IBS with gas and bloating, internal hemorrhoids is here for follow-up.   1. GERD/dyspepsia -- well-controlled on pantoprazole 40 mg daily. We will continue this therapy. We discussed the risks, benefits and alternatives to chronic PPI use and he wishes to continue this medication.  2. Internal  hemorrhoids -- prolapse and scant bleeding, recurrent after hemorrhoidal banding. He had good treatment response times about 6 months with recurrence though less significant than prior to banding. Will treat with hemorrhoidal suppository, hydrocortisone 25 mg, 3 days daily at bedtime. If this resolve symptoms he can use this very intermittently as needed. If not we can have him return for endoscopy and repeat banding. He would prefer to try banding again before considering surgery for hemorrhoidal resection.  Can follow-up in 2 years, sooner if ongoing hemorrhoidal prolapse and bleeding symptoms  15 minutes spent with the patient today. Greater than 50% was spent in counseling and coordination of care with the patient
